# Patient Record
Sex: Male | Born: 2000
Health system: Southern US, Community
[De-identification: ages and names within clinical notes are randomized; demographics above are authoritative.]

## PROBLEM LIST (undated history)

## (undated) HISTORY — PX: OTHER SURGICAL HISTORY: SHX169

## (undated) HISTORY — PX: CIRCUMCISION: SHX1350

---
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
## 2021-01-08 ENCOUNTER — Encounter: Payer: Self-pay | Admitting: Family Medicine

## 2021-01-08 ENCOUNTER — Other Ambulatory Visit: Payer: Self-pay

## 2021-01-08 ENCOUNTER — Ambulatory Visit (INDEPENDENT_AMBULATORY_CARE_PROVIDER_SITE_OTHER): Payer: 59 | Admitting: Family Medicine

## 2021-01-08 VITALS — BP 105/65 | HR 81 | Resp 16 | Ht 64.0 in | Wt 142.0 lb

## 2021-01-08 DIAGNOSIS — L500 Allergic urticaria: Secondary | ICD-10-CM | POA: Diagnosis not present

## 2021-01-08 NOTE — Progress Notes (Signed)
I,[name],acting as a scribe for Megan Mans, MD.,have documented all relevant documentation on the behalf of Megan Mans, MD,as directed by  Megan Mans, MD while in the presence of Megan Mans, MD.   Established patient visit   Patient: Anthony Schwartz   DOB: 01-26-2001   20 y.o. Male  MRN: [id number] Visit Date: 01/08/2021  Today's healthcare provider: Megan Mans, MD   Chief Complaint  Patient presents with   Follow-up   Subjective  --------------------------------------------------------------------------------------  HPI  Patient is here for follow up office visit.  Patient states every 2 to 3 months for the past few years he gets hives after eating.  After the hives he has headache that last for several days.  Benadryl works but he is wonders about what kind of allergy he could have.  He has no shortness of breath with this and no lasting rash.  He does have urticaria and itching for the day after these events happen. 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     Medications: Outpatient Medications Prior to Visit  Medication Sig   [DISCONTINUED] azithromycin (ZITHROMAX) 250 MG tablet Take 2 tablets on the first day then one daily until finished.   [DISCONTINUED] fluticasone (FLONASE) 50 MCG/ACT nasal spray SPRAY 2 SPRAYS INTO EACH NOSTRIL EVERY DAY   No facility-administered medications prior to visit.    Review of Systems      Objective  -------------------------------------------------------------------------------------------------------------------- BP 105/65 (BP Location: Left Arm, Patient Position: Sitting, Cuff Size: Normal)   Pulse 81   Resp 16   Ht 5\' 4"  (1.626 m)   Wt 142 lb (64.4 kg)   SpO2 97%   BMI 24.37 kg/m      Physical Exam Vitals reviewed.  Constitutional:      Appearance: He is well-developed.  HENT:     Head: Normocephalic and atraumatic.     Right Ear:  External ear normal.     Left Ear: External ear normal.     Nose: Nose normal.  Eyes:     General: No scleral icterus.    Conjunctiva/sclera: Conjunctivae normal.     Pupils: Pupils are equal, round, and reactive to light.  Neck:     Thyroid: No thyromegaly.  Cardiovascular:     Rate and Rhythm: Normal rate and regular rhythm.     Heart sounds: Normal heart sounds.  Pulmonary:     Effort: Pulmonary effort is normal.     Breath sounds: Normal breath sounds.  Abdominal:     Palpations: Abdomen is soft.  Musculoskeletal:        General: No deformity.  Skin:    General: Skin is warm and dry.  Neurological:     General: No focal deficit present.     Mental Status: He is alert and oriented to person, place, and time.     Cranial Nerves: No cranial nerve deficit.     Motor: No abnormal muscle tone.     Coordination: Coordination normal.  Psychiatric:        Mood and Affect: Mood normal.        Behavior: Behavior normal.        Thought Content: Thought content normal.        Judgment: Judgment normal.      No results found for any visits on 01/08/21.  Assessment & Plan  --------------------------------------------------------------------------------------  1. 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 Consider Zyrtec to try to prevent outbreaks.  Refer to allergist. - Ambulatory referral to Allergy - CBC w/Diff/Platelet - Comprehensive Metabolic Panel (CMET) - TSH - Alpha-Gal Panel   No follow-ups on file.      I, Megan Mans, MD, have reviewed all documentation for this visit. The documentation on 01/14/21 for the exam, diagnosis, procedures, and orders are all accurate and complete.    Kariah Loredo Wendelyn Breslow, MD  [facility] [phone] (phone) [phone] (fax)  [facility]

## 2021-01-12 LAB — CBC WITH DIFFERENTIAL/PLATELET
Basophils Absolute: 0 10*3/uL (ref 0.0–0.2)
Basos: 1 %
EOS (ABSOLUTE): 0.1 10*3/uL (ref 0.0–0.4)
Eos: 1 %
Hematocrit: 46.6 % (ref 37.5–51.0)
Hemoglobin: 16.4 g/dL (ref 13.0–17.7)
Immature Grans (Abs): 0 10*3/uL (ref 0.0–0.1)
Immature Granulocytes: 0 %
Lymphocytes Absolute: 2.2 10*3/uL (ref 0.7–3.1)
Lymphs: 35 %
MCH: 30.7 pg (ref 26.6–33.0)
MCHC: 35.2 g/dL (ref 31.5–35.7)
MCV: 87 fL (ref 79–97)
Monocytes Absolute: 0.7 10*3/uL (ref 0.1–0.9)
Monocytes: 10 %
Neutrophils Absolute: 3.4 10*3/uL (ref 1.4–7.0)
Neutrophils: 53 %
Platelets: 342 10*3/uL (ref 150–450)
RBC: 5.34 x10E6/uL (ref 4.14–5.80)
RDW: 11.9 % (ref 11.6–15.4)
WBC: 6.4 10*3/uL (ref 3.4–10.8)

## 2021-01-12 LAB — COMPREHENSIVE METABOLIC PANEL
ALT: 22 IU/L (ref 0–44)
AST: 26 IU/L (ref 0–40)
Albumin/Globulin Ratio: 2 (ref 1.2–2.2)
Albumin: 4.7 g/dL (ref 4.1–5.2)
Alkaline Phosphatase: 159 IU/L — ABNORMAL HIGH (ref 51–125)
BUN/Creatinine Ratio: 14 (ref 9–20)
BUN: 16 mg/dL (ref 6–20)
Bilirubin Total: <0.2 mg/dL (ref 0.0–1.2)
CO2: 22 mmol/L (ref 20–29)
Calcium: 9.8 mg/dL (ref 8.7–10.2)
Chloride: 105 mmol/L (ref 96–106)
Creatinine, Ser: 1.18 mg/dL (ref 0.76–1.27)
Globulin, Total: 2.3 g/dL (ref 1.5–4.5)
Glucose: 120 mg/dL — ABNORMAL HIGH (ref 65–99)
Potassium: 3.8 mmol/L (ref 3.5–5.2)
Sodium: 142 mmol/L (ref 134–144)
Total Protein: 7 g/dL (ref 6.0–8.5)
eGFR: 91 mL/min/{1.73_m2} (ref >59)

## 2021-01-12 LAB — ALPHA-GAL PANEL
Allergen Lamb IgE: 1.08 kU/L — AB
Beef IgE: 1.41 kU/L — AB
IgE (Immunoglobulin E), Serum: 152 IU/mL (ref 6–495)
O215-IgE Alpha-Gal: 2.42 kU/L — AB
Pork IgE: 1.12 kU/L — AB

## 2021-01-12 LAB — TSH: TSH: 1.11 u[IU]/mL (ref 0.450–4.500)

## 2021-06-15 ENCOUNTER — Ambulatory Visit
Admission: EM | Admit: 2021-06-15 | Discharge: 2021-06-15 | Disposition: A | Discharge status: Home / Self Care | Payer: 59 | Attending: Emergency Medicine | Admitting: Emergency Medicine

## 2021-06-15 ENCOUNTER — Ambulatory Visit: Payer: Self-pay

## 2021-06-15 ENCOUNTER — Encounter: Payer: Self-pay | Admitting: Emergency Medicine

## 2021-06-15 ENCOUNTER — Other Ambulatory Visit: Payer: Self-pay

## 2021-06-15 DIAGNOSIS — J0100 Acute maxillary sinusitis, unspecified: Secondary | ICD-10-CM | POA: Diagnosis not present

## 2021-06-15 MED ORDER — AMOXICILLIN 875 MG PO TABS: 875.0000 mg | ORAL_TABLET | Freq: Two times a day (BID) | ORAL | 0 refills | Status: AC

## 2021-06-15 NOTE — Telephone Encounter (Signed)
Patient mom Joeseph Verville called in to get him an appointment but nothing available say that he have been sick for the last week or so. Per mom he has sore throat, sinus pressure and pain fatigue, chills had fever and has a cough. Please call with advise to [facility] at Ph# [phone]    Chief Complaint: Sinus pain, congestion, fever, body aches, cough Symptoms: Above Frequency: Started 2 weeks ago Pertinent Negatives: Patient denies SOB Disposition: [] ED /[x] Urgent Care (no appt availability in office) / [] Appointment(In office/virtual)/ []  [facility]/ [] Home Care/ [] Refused Recommended Disposition /[] [facility]/ []  Follow-up with PCP Additional Notes:    Reason for Disposition  [1] Sinus pain (not just congestion) AND [2] fever  Answer Assessment - Initial Assessment Questions 1. LOCATION: "Where does it hurt?"      Headache 2. ONSET: "When did the sinus pain start?"  (e.g., hours, days)      2 weeks ago 3. SEVERITY: "How bad is the pain?"   (Scale 1-10; mild, moderate or severe)   - MILD (1-3): doesn't interfere with normal activities    - MODERATE (4-7): interferes with normal activities (e.g., work or school) or awakens from sleep   - SEVERE (8-10): excruciating pain and patient unable to do any normal activities        Moderate 4. RECURRENT SYMPTOM: "Have you ever had sinus problems before?" If Yes, ask: "When was the last time?" and "What happened that time?"      Yes 5. NASAL CONGESTION: "Is the nose blocked?" If Yes, ask: "Can you open it or must you breathe through your mouth?"     Yes 6. NASAL DISCHARGE: "Do you have discharge from your nose?" If so ask, "What color?"     Had been green 7. FEVER: "Do you have a fever?" If Yes, ask: "What is it, how was it measured, and when did it start?"      Yes 8. OTHER SYMPTOMS: "Do you have any other symptoms?" (e.g., sore throat, cough, earache, difficulty breathing)     Sore throat, cough 9. PREGNANCY: "Is  there any chance you are pregnant?" "When was your last menstrual period?"     N/a  Protocols used: Sinus Pain or Congestion-A-AH

## 2021-06-15 NOTE — Telephone Encounter (Signed)
Mom given lab results from 12/2020. States pt. "Never received a letter with results."

## 2021-06-15 NOTE — Discharge Instructions (Addendum)
Take the amoxicillin as directed. Follow up with your primary care provider as discussed.

## 2021-06-15 NOTE — ED Provider Notes (Signed)
[name]    CSN: [id number] Arrival date & time: 06/15/21  1231      History   Chief Complaint Chief Complaint  Patient presents with   Cough   Nasal Congestion   Fever    HPI Anthony Schwartz is a 21 y.o. male.  Patient presents with 1 week history of low-grade fever, nasal congestion, sinus pressure, sore throat, cough.  T-max 99.5.  Treatment attempted at home with OTC sinus medication.  He denies shortness of breath, vomiting, diarrhea, or other symptoms.  The history is provided by the patient.   History reviewed. No pertinent past medical history.  Patient Active Problem List   Diagnosis Date Noted   Acute non intractable tension-type headache 08/01/2017   Excessive somnolence disorder 08/01/2017   History of closed head injury 10/04/2016   Closed head injury without loss of consciousness 04/09/2016   Postconcussion syndrome 04/09/2016   Acute posttraumatic headache 04/09/2016   Breast development in males 11/12/2014   Plantar fasciitis 11/12/2014   Plantar verruca 11/12/2014    Past Surgical History:  Procedure Laterality Date   Broken wrist/arm     CIRCUMCISION     none         Home Medications    Prior to Admission medications   Medication Sig Start Date End Date Taking? Authorizing Provider  amoxicillin (AMOXIL) 875 MG tablet Take 1 tablet (875 mg total) by mouth 2 (two) times daily for 10 days. 06/15/21 06/25/21 Yes Sharion Balloon, NP    Family History Family History  Problem Relation Age of Onset   Depression Mother    Depression Father    Migraines Sister     Social History Social History   Tobacco Use   Smoking status: Never   Smokeless tobacco: Never  Vaping Use   Vaping Use: Never used  Substance Use Topics   Alcohol use: No    Alcohol/week: 0.0 standard drinks   Drug use: No     Allergies   Patient has no known allergies.   Review of Systems Review of Systems  Constitutional:  Positive for fever. Negative for  chills.  HENT:  Positive for congestion, sinus pressure and sore throat. Negative for ear pain.   Respiratory:  Positive for cough. Negative for shortness of breath.   Cardiovascular:  Negative for chest pain and palpitations.  Gastrointestinal:  Negative for diarrhea and vomiting.  Skin:  Negative for color change and rash.  All other systems reviewed and are negative.   Physical Exam Triage Vital Signs ED Triage Vitals  Enc Vitals Group     BP 06/15/21 1242 132/72     Pulse Rate 06/15/21 1242 81     Resp 06/15/21 1242 18     Temp 06/15/21 1242 98.2 F (36.8 C)     Temp src --      SpO2 06/15/21 1242 98 %     Weight --      Height --      Head Circumference --      Peak Flow --      Pain Score 06/15/21 1249 4     Pain Loc --      Pain Edu? --      Excl. in [place]? --    No data found.  Updated Vital Signs BP 132/72 (BP Location: Left Arm)    Pulse 81    Temp 98.2 F (36.8 C)    Resp 18    SpO2 98%  Visual Acuity Right Eye Distance:   Left Eye Distance:   Bilateral Distance:    Right Eye Near:   Left Eye Near:    Bilateral Near:     Physical Exam Vitals and nursing note reviewed.  Constitutional:      General: He is not in acute distress.    Appearance: He is well-developed. He is not ill-appearing.  HENT:     Right Ear: Tympanic membrane normal.     Left Ear: Tympanic membrane normal.     Nose: Congestion present.     Mouth/Throat:     Mouth: Mucous membranes are moist.     Pharynx: Oropharynx is clear.  Cardiovascular:     Rate and Rhythm: Normal rate and regular rhythm.     Heart sounds: Normal heart sounds.  Pulmonary:     Effort: Pulmonary effort is normal. No respiratory distress.     Breath sounds: Normal breath sounds.  Musculoskeletal:     Cervical back: Neck supple.  Skin:    General: Skin is warm and dry.  Neurological:     Mental Status: He is alert.  Psychiatric:        Mood and Affect: Mood normal.        Behavior: Behavior normal.      UC Treatments / Results  Labs (all labs ordered are listed, but only abnormal results are displayed) Labs Reviewed - No data to display  EKG   Radiology No results found.  Procedures Procedures (including critical care time)  Medications Ordered in UC Medications - No data to display  Initial Impression / Assessment and Plan / UC Course  I have reviewed the triage vital signs and the nursing notes.  Pertinent labs & imaging results that were available during my care of the patient were reviewed by me and considered in my medical decision making (see chart for details).    Acute sinusitis.  Patient has been symptomatic for a week and is not improving with OTC medication.  Treating with amoxicillin.  Discussed symptomatic care also.  Instructed patient to follow-up with his primary care provider.  He agrees to plan of care.    Final Clinical Impressions(s) / UC Diagnoses   Final diagnoses:  Acute non-recurrent maxillary sinusitis     Discharge Instructions      Take the amoxicillin as directed. Follow up with your primary care provider as discussed.        ED Prescriptions     Medication Sig Dispense Auth. Provider   amoxicillin (AMOXIL) 875 MG tablet Take 1 tablet (875 mg total) by mouth 2 (two) times daily for 10 days. 20 tablet Sharion Balloon, NP      PDMP not reviewed this encounter.   Sharion Balloon, NP 06/15/21 1355

## 2021-06-15 NOTE — ED Triage Notes (Signed)
Pt here with recurrent nasal congestion, cough, and fever x 5 days. He states this happens almost every month and is unsure of what to do next.

## 2021-06-29 ENCOUNTER — Other Ambulatory Visit: Payer: Self-pay

## 2021-06-29 ENCOUNTER — Encounter: Payer: Self-pay | Admitting: Family Medicine

## 2021-06-29 ENCOUNTER — Ambulatory Visit: Payer: 59 | Admitting: Family Medicine

## 2021-06-29 VITALS — BP 134/67 | HR 79 | Temp 98.1°F | Resp 16 | Wt 149.9 lb

## 2021-06-29 DIAGNOSIS — J0101 Acute recurrent maxillary sinusitis: Secondary | ICD-10-CM | POA: Diagnosis not present

## 2021-06-29 DIAGNOSIS — H6501 Acute serous otitis media, right ear: Secondary | ICD-10-CM | POA: Diagnosis not present

## 2021-06-29 DIAGNOSIS — R062 Wheezing: Secondary | ICD-10-CM

## 2021-06-29 DIAGNOSIS — R599 Enlarged lymph nodes, unspecified: Secondary | ICD-10-CM | POA: Insufficient documentation

## 2021-06-29 MED ORDER — ALBUTEROL SULFATE HFA 108 (90 BASE) MCG/ACT IN AERS: 2.0000 | INHALATION_SPRAY | Freq: Four times a day (QID) | RESPIRATORY_TRACT | 2 refills | Status: DC | PRN

## 2021-06-29 MED ORDER — DOXYCYCLINE HYCLATE 100 MG PO TABS: 100.0000 mg | ORAL_TABLET | Freq: Two times a day (BID) | ORAL | 0 refills | Status: DC

## 2021-06-29 MED ORDER — PREDNISONE 10 MG (21) PO TBPK: ORAL_TABLET | ORAL | 0 refills | Status: DC

## 2021-06-29 MED ORDER — AZITHROMYCIN 250 MG PO TABS: ORAL_TABLET | ORAL | 0 refills | Status: AC

## 2021-06-29 NOTE — Assessment & Plan Note (Signed)
Albuterol for active wheezing

## 2021-06-29 NOTE — Assessment & Plan Note (Signed)
Prednisone to assist

## 2021-06-29 NOTE — Assessment & Plan Note (Signed)
Likely incomplete treatment prior Now with R ear involvement Active wheezing

## 2021-06-29 NOTE — Patient Instructions (Signed)
Recommend OTC, over the counter purchase, to assist with recovery: ° °Nasal spray- Ocean nasal spray or similar, to ensure nasal passageways are moist and not dry ° °Nasal steroid- Flonase, or similar, assist with decrease inflammation, may not notice effect for 4 days or longer, can take 2 x/day. Best to blow nose prior to use.  ° °Delym or Robitussin DM- to assist with cough, follow directions on package ° °Mucinex DM- ensure 32-64 oz of water to activate ° °Throat lozenges, sugar free candies, water- keep throat moistened  ° °

## 2021-06-29 NOTE — Progress Notes (Signed)
Established patient visit   Patient: Anthony Schwartz   DOB: May 30, 2000   20 y.o. Male  MRN: [id number] Visit Date: 06/29/2021  Today's healthcare provider: Jacky Kindle, FNP   Chief Complaint  Patient presents with   URI   I,Sulibeya S Dimas,acting as a scribe for Jacky Kindle, FNP.,have documented all relevant documentation on the behalf of Jacky Kindle, FNP,as directed by  Jacky Kindle, FNP while in the presence of Jacky Kindle, FNP.  Subjective    URI  This is a recurrent problem. The current episode started 1 to 4 weeks ago. The problem has been waxing and waning. There has been no fever. Associated symptoms include congestion, coughing, ear pain, rhinorrhea and a sore throat. Treatments tried: antibiotic. The treatment provided mild relief.  patient reports he was seen at Urgent Care and was prescribed antibiotics. Medications: No outpatient medications prior to visit.   No facility-administered medications prior to visit.    Review of Systems  Constitutional:  Negative for appetite change and fever.  HENT:  Positive for congestion, ear pain, rhinorrhea and sore throat.   Respiratory:  Positive for cough.        Objective    BP 134/67 (BP Location: Left Arm, Patient Position: Sitting, Cuff Size: Large)    Pulse 79    Temp 98.1 F (36.7 C) (Temporal)    Resp 16    Wt 149 lb 14.4 oz (68 kg)    SpO2 100%    BMI 25.73 kg/m  BP Readings from Last 3 Encounters:  06/29/21 134/67  06/15/21 132/72  01/08/21 105/65   Wt Readings from Last 3 Encounters:  06/29/21 149 lb 14.4 oz (68 kg) (43 %, Z= -0.18)*  01/08/21 142 lb (64.4 kg) (32 %, Z= -0.47)*  11/19/18 147 lb (66.7 kg) (58 %, Z= 0.21)*   * Growth percentiles are based on CDC (Boys, 2-20 Years) data.      Physical Exam Vitals and nursing note reviewed.  Constitutional:      General: He is not in acute distress.    Appearance: Normal appearance. He is normal weight. He is not ill-appearing or  toxic-appearing.  HENT:     Head: Normocephalic and atraumatic.     Right Ear: Hearing, ear canal and external ear normal. Tenderness present. A middle ear effusion is present. There is no impacted cerumen. Tympanic membrane is erythematous.     Left Ear: Hearing, tympanic membrane, ear canal and external ear normal.     Nose: Congestion and rhinorrhea present.     Right Turbinates: Not enlarged, swollen or pale.     Left Turbinates: Not enlarged, swollen or pale.     Right Sinus: Maxillary sinus tenderness present. No frontal sinus tenderness.     Left Sinus: Maxillary sinus tenderness present.     Mouth/Throat:     Pharynx: Uvula midline. Pharyngeal swelling present. No oropharyngeal exudate.  Eyes:     Pupils: Pupils are equal, round, and reactive to light.  Cardiovascular:     Rate and Rhythm: Normal rate and regular rhythm.     Pulses: Normal pulses.     Heart sounds: Normal heart sounds.  Pulmonary:     Effort: Pulmonary effort is normal.     Breath sounds: Examination of the right-upper field reveals wheezing. Examination of the left-upper field reveals wheezing. Examination of the right-middle field reveals wheezing. Examination of the left-middle field reveals wheezing. Examination of the right-lower  field reveals decreased breath sounds and wheezing. Examination of the left-lower field reveals decreased breath sounds and wheezing. Decreased breath sounds and wheezing present.  Musculoskeletal:        General: Normal range of motion.     Cervical back: Normal range of motion.  Skin:    General: Skin is warm and dry.     Capillary Refill: Capillary refill takes less than 2 seconds.  Neurological:     General: No focal deficit present.     Mental Status: He is alert and oriented to person, place, and time. Mental status is at baseline.  Psychiatric:        Mood and Affect: Mood normal.        Behavior: Behavior normal.        Thought Content: Thought content normal.         Judgment: Judgment normal.     No results found for any visits on 06/29/21.  Assessment & Plan     Problem List Items Addressed This Visit       Respiratory   Acute recurrent maxillary sinusitis - Primary    Likely incomplete treatment prior Now with R ear involvement Active wheezing       Relevant Medications   doxycycline (VIBRA-TABS) 100 MG tablet   azithromycin (ZITHROMAX) 250 MG tablet   predniSONE (STERAPRED UNI-PAK 21 TAB) 10 MG (21) TBPK tablet     Nervous and Auditory   Non-recurrent acute serous otitis media of right ear    2 abx to assist with sinusitis with associated R AOM      Relevant Medications   doxycycline (VIBRA-TABS) 100 MG tablet   azithromycin (ZITHROMAX) 250 MG tablet     Immune and Lymphatic   Swollen gland    Prednisone to assist      Relevant Medications   predniSONE (STERAPRED UNI-PAK 21 TAB) 10 MG (21) TBPK tablet     Other   Wheezing    Albuterol for active wheezing      Relevant Medications   albuterol (VENTOLIN HFA) 108 (90 Base) MCG/ACT inhaler     Return if symptoms worsen or fail to improve.     Leilani Merl, FNP, have reviewed all documentation for this visit. The documentation on 06/29/21 for the exam, diagnosis, procedures, and orders are all accurate and complete.    Jacky Kindle, FNP  [facility] [phone] (phone) [phone] (fax)  [facility]

## 2021-06-29 NOTE — Assessment & Plan Note (Signed)
2 abx to assist with sinusitis with associated R AOM

## 2021-12-19 ENCOUNTER — Ambulatory Visit: Payer: 59 | Admitting: Family Medicine

## 2021-12-19 ENCOUNTER — Encounter: Payer: Self-pay | Admitting: Family Medicine

## 2021-12-19 VITALS — BP 116/68 | HR 88 | Resp 16 | Wt 141.0 lb

## 2021-12-19 DIAGNOSIS — M25512 Pain in left shoulder: Secondary | ICD-10-CM

## 2021-12-19 MED ORDER — NAPROXEN 500 MG PO TABS: 500.0000 mg | ORAL_TABLET | Freq: Two times a day (BID) | ORAL | 2 refills | Status: AC | PRN

## 2021-12-19 NOTE — Progress Notes (Signed)
Established patient visit  I,[name],acting as a scribe for Megan Mans, MD.,have documented all relevant documentation on the behalf of Megan Mans, MD,as directed by  Megan Mans, MD while in the presence of Megan Mans, MD.   Patient: Anthony Schwartz   DOB: [birth year]   21 y.o. Male  MRN: [id number] Visit Date: 12/19/2021  Today's healthcare provider: Megan Mans, MD   Chief Complaint  Patient presents with   Shoulder Pain   Subjective    HPI  Patient is here concerning left shoulder pain. Patient states he had issues with his left shoulder about 2 years ago. Patient states his left shoulder popped 2 months ago.  He was on the bench press and both felt and heard a pop.  He has had pain since. Limited range of motion. No home treatments. 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 (Patient not taking: Reported on 12/19/2021)   [DISCONTINUED] doxycycline (VIBRA-TABS) 100 MG tablet Take 1 tablet (100 mg total) by mouth 2 (two) times daily. (Patient not taking: Reported on 12/19/2021)   [DISCONTINUED] predniSONE (STERAPRED UNI-PAK 21 TAB) 10 MG (21) TBPK tablet Take in the morning, with food. Take as directed on package. (Patient not taking: Reported on 12/19/2021)   No facility-administered medications prior to visit.    Review of Systems  Constitutional:  Negative for appetite change, chills and fever.  Respiratory:  Negative for chest tightness, shortness of breath and wheezing.   Cardiovascular:  Negative for chest pain and palpitations.  Gastrointestinal:  Negative for abdominal pain, nausea and vomiting.  Musculoskeletal:  Positive for arthralgias.        Objective    BP  116/68 (BP Location: Right Arm, Patient Position: Sitting, Cuff Size: Normal)   Pulse 88   Resp 16   Wt 141 lb (64 kg)   SpO2 99%   BMI 24.20 kg/m  Wt Readings from Last 3 Encounters:  12/19/21 141 lb (64 kg)  06/29/21 149 lb 14.4 oz (68 kg) (43 %, Z= -0.18)*  01/08/21 142 lb (64.4 kg) (32 %, Z= -0.47)*   * Growth percentiles are based on CDC (Boys, 2-20 Years) data.      Physical Exam Vitals reviewed.  Constitutional:      General: He is not in acute distress.    Appearance: He is well-developed.  HENT:     Head: Normocephalic and atraumatic.     Right Ear: Hearing normal.     Left Ear: Hearing normal.     Nose: Nose normal.  Eyes:     General: Lids are normal. No scleral icterus.       Right eye: No discharge.        Left eye: No discharge.     Conjunctiva/sclera: Conjunctivae normal.  Cardiovascular:     Rate and Rhythm: Normal rate and regular rhythm.     Heart sounds: Normal heart sounds.  Pulmonary:     Effort: Pulmonary effort is normal. No respiratory distress.  Musculoskeletal:        General: Normal range of motion.     Comments: Discomfort with abduction of left shoulder  Skin:    Findings: No lesion or rash.  Neurological:     General: No focal deficit present.     Mental Status: He is alert and oriented to person, place, and time.  Psychiatric:        Mood and Affect: Mood normal.        Speech: Speech normal.        Behavior: Behavior normal.        Thought Content: Thought content normal.        Judgment: Judgment normal.       No results found for any visits on 12/19/21.  Assessment & Plan     1. Acute pain of left shoulder Treat with naproxen and refer back to patient preference of orthopedics who is a Careers adviser who did his right shoulder surgery, Dr. Clydene Pugh - AMB referral to orthopedics   No follow-ups on file.      I, Megan Mans, MD, have reviewed all documentation for this visit. The documentation on 12/21/21 for the exam,  diagnosis, procedures, and orders are all accurate and complete.    Wray Goehring Wendelyn Breslow, MD  [facility] [phone] (phone) [phone] (fax)  [facility]

## 2024-01-27 ENCOUNTER — Other Ambulatory Visit: Payer: Self-pay

## 2024-01-27 ENCOUNTER — Emergency Department
Admission: EM | Admit: 2024-01-27 | Discharge: 2024-01-27 | Disposition: A | Discharge status: Home / Self Care | Attending: Emergency Medicine | Admitting: Emergency Medicine

## 2024-01-27 ENCOUNTER — Emergency Department

## 2024-01-27 DIAGNOSIS — W232XXA Caught, crushed, jammed or pinched between a moving and stationary object, initial encounter: Secondary | ICD-10-CM | POA: Diagnosis not present

## 2024-01-27 DIAGNOSIS — S6701XA Crushing injury of right thumb, initial encounter: Secondary | ICD-10-CM | POA: Diagnosis not present

## 2024-01-27 DIAGNOSIS — M79644 Pain in right finger(s): Secondary | ICD-10-CM | POA: Diagnosis present

## 2024-01-27 NOTE — ED Provider Notes (Signed)
   [facility] Provider Note    Event Date/Time   First MD Initiated Contact with Patient 01/27/24 [id number]     (approximate)   History   Finger Injury (/)   HPI  Anthony Schwartz is a 23 y.o. male with no significant past medical history and as listed in EMR presents to the emergency department for treatment and evaluation of pain in the right thumb after smashing it between a pipe and a saw yesterday. Bleeding stopped after it was bandaged. He hit it this morning and started bleeding again. Injury occurred yesterday around 3pm. .     Physical Exam    Vitals:   01/27/24 0633 01/27/24 0832  BP: 135/89 132/88  Pulse: 94 88  Resp: 19 18  Temp: 97.7 F (36.5 C) 97.7 F (36.5 C)  SpO2: 100% 99%    General: Awake, no distress.  CV:  Good peripheral perfusion.  Resp:  Normal effort.  Abd:  No distention.  Other:  Bleeding from right thumb was coming from under the nail. Nail is firmly attached to the nailbed. Nail is intact. No subungual hematoma.   ED Results / Procedures / Treatments   Labs (all labs ordered are listed, but only abnormal results are displayed)  Labs Reviewed - No data to display   EKG  Not indicated.   RADIOLOGY  Image and radiology report reviewed and interpreted by me. Radiology report consistent with the same.  No acute bony abnormality noted on imaging of the right thumb.  PROCEDURES:  Critical Care performed: No  Procedures   MEDICATIONS ORDERED IN ED:  Medications - No data to display   IMPRESSION / MDM / ASSESSMENT AND PLAN / ED COURSE   I have reviewed the triage note and vital signs. Vital signs stable   Differential diagnosis includes, but is not limited to, crush injury, tuft fracture, subungual hematoma, laceration  Patient's presentation is most consistent with acute illness / injury with system symptoms.  23 year old male presenting to the emergency department for treatment and evaluation after  thumb injury that occurred yesterday.  See HPI for further details.  No active bleeding once bandage was removed.  X-ray is negative for fracture.  Plan will be to have him soak in Betadine and saline solution to clean the wound and further evaluate.  Patient declined pain medication.  Dried blood cleared from finger tip. No laceration. Blood had come from under the nail, but is no longer bleeding.  Plan will be to apply splint for protection of the wound.  He was advised to rotate Tylenol  and ibuprofen  or Naprosyn  and use ice and elevate the next few days.  Work excuse will be provided.    FINAL CLINICAL IMPRESSION(S) / ED DIAGNOSES   Final diagnoses:  Crush injury to thumb, right, initial encounter     Rx / DC Orders   ED Discharge Orders     None        Note:  This document was prepared using Dragon voice recognition software and may include unintentional dictation errors.   Herlinda Kirk NOVAK, FNP 01/27/24 9150    Waymond Lorelle Cummins, MD 01/27/24 [phone]

## 2024-01-27 NOTE — ED Notes (Signed)
 Patient stated he slipped while carrying some stuff yesterday and a saw went into his right thumb. Patient stated he had a tetanus shot last November. Bleeding is controlled at this time.

## 2024-01-27 NOTE — Discharge Instructions (Signed)
 Wear the splint until the pain has improved and the swelling is gone. You may take it off to shower, but put it back on afterward.  Follow up with primary care if not improving over the week or sooner for symptoms of concern.

## 2024-01-27 NOTE — ED Triage Notes (Signed)
 Pt reports he hit his right thumb nail on a pipe and saw yesterday that caused it to bleed, pt reports this morning he hit it again and bleeding began again. Pt has finger wrapped in triage.
